# Patient Record
Sex: Female | Born: 1935 | Race: White | Hispanic: No | Marital: Married | Smoking: Former smoker
Health system: Southern US, Community
[De-identification: ages and names within clinical notes are randomized; demographics above are authoritative.]

## PROBLEM LIST (undated history)

## (undated) DIAGNOSIS — G4730 Sleep apnea, unspecified: Secondary | ICD-10-CM

## (undated) DIAGNOSIS — Z5189 Encounter for other specified aftercare: Secondary | ICD-10-CM

## (undated) DIAGNOSIS — M1990 Unspecified osteoarthritis, unspecified site: Secondary | ICD-10-CM

## (undated) DIAGNOSIS — I2510 Atherosclerotic heart disease of native coronary artery without angina pectoris: Secondary | ICD-10-CM

## (undated) DIAGNOSIS — K219 Gastro-esophageal reflux disease without esophagitis: Secondary | ICD-10-CM

## (undated) DIAGNOSIS — I739 Peripheral vascular disease, unspecified: Secondary | ICD-10-CM

## (undated) DIAGNOSIS — E119 Type 2 diabetes mellitus without complications: Secondary | ICD-10-CM

## (undated) DIAGNOSIS — J449 Chronic obstructive pulmonary disease, unspecified: Secondary | ICD-10-CM

## (undated) DIAGNOSIS — R011 Cardiac murmur, unspecified: Secondary | ICD-10-CM

## (undated) DIAGNOSIS — I219 Acute myocardial infarction, unspecified: Secondary | ICD-10-CM

## (undated) DIAGNOSIS — I10 Essential (primary) hypertension: Secondary | ICD-10-CM

## (undated) DIAGNOSIS — J45909 Unspecified asthma, uncomplicated: Secondary | ICD-10-CM

## (undated) DIAGNOSIS — G2581 Restless legs syndrome: Secondary | ICD-10-CM

## (undated) HISTORY — PX: ORIF ANKLE FRACTURE: SHX5408

## (undated) HISTORY — DX: Type 2 diabetes mellitus without complications: E11.9

## (undated) HISTORY — PX: APPENDECTOMY: SHX54

## (undated) HISTORY — DX: Unspecified osteoarthritis, unspecified site: M19.90

## (undated) HISTORY — PX: TONSILLECTOMY: SUR1361

## (undated) HISTORY — PX: DILATION AND CURETTAGE OF UTERUS: SHX78

## (undated) HISTORY — PX: EYE SURGERY: SHX253

## (undated) HISTORY — DX: Encounter for other specified aftercare: Z51.89

## (undated) HISTORY — PX: COLONOSCOPY: SHX5424

## (undated) HISTORY — PX: ABDOMINAL AORTIC ANEURYSM REPAIR: SUR1152

## (undated) HISTORY — DX: Acute myocardial infarction, unspecified: I21.9

## (undated) HISTORY — PX: THYROIDECTOMY, PARTIAL: SHX18

## (undated) HISTORY — DX: Gastro-esophageal reflux disease without esophagitis: K21.9

---

## 1964-07-30 HISTORY — PX: ABDOMINAL HYSTERECTOMY: SHX81

## 1995-07-31 DIAGNOSIS — I2510 Atherosclerotic heart disease of native coronary artery without angina pectoris: Secondary | ICD-10-CM

## 1995-07-31 HISTORY — DX: Atherosclerotic heart disease of native coronary artery without angina pectoris: I25.10

## 2012-07-30 HISTORY — PX: CHOLECYSTECTOMY: SHX55

## 2013-12-15 ENCOUNTER — Other Ambulatory Visit: Payer: Self-pay | Admitting: Specialist

## 2013-12-15 DIAGNOSIS — N6452 Nipple discharge: Secondary | ICD-10-CM

## 2013-12-23 ENCOUNTER — Ambulatory Visit
Admission: RE | Admit: 2013-12-23 | Discharge: 2013-12-23 | Disposition: A | Discharge status: Home / Self Care | Payer: Medicare Other | Source: Ambulatory Visit | Attending: Specialist | Admitting: Specialist

## 2013-12-23 DIAGNOSIS — N6452 Nipple discharge: Secondary | ICD-10-CM

## 2014-01-11 ENCOUNTER — Encounter (INDEPENDENT_AMBULATORY_CARE_PROVIDER_SITE_OTHER): Payer: Self-pay | Admitting: Surgery

## 2014-01-11 ENCOUNTER — Ambulatory Visit (INDEPENDENT_AMBULATORY_CARE_PROVIDER_SITE_OTHER): Payer: Medicare Other | Admitting: Surgery

## 2014-01-11 VITALS — BP 124/76 | HR 62 | Resp 14 | Ht 65.0 in | Wt 167.4 lb

## 2014-01-11 DIAGNOSIS — N6459 Other signs and symptoms in breast: Secondary | ICD-10-CM

## 2014-01-11 DIAGNOSIS — N6452 Nipple discharge: Secondary | ICD-10-CM | POA: Insufficient documentation

## 2014-01-11 NOTE — Progress Notes (Signed)
Patient ID: Meagan Sanders, female   DOB: 1933/03/02, 80 y.o.   MRN: [id number]  No chief complaint on file.   HPI Meagan PerlaJean Romanek is a 80 y.o. female.   HPI Patient sent at the request at the [facility] [place] and Glori Bickersajendra Trivedi, MD For 1 year  history of left nipple discharge. The discharge is using clear but of late patient has noted some blood in the discharge. No mass or breast tenderness. No redness. Right breast is normal. Ductogram reveals filling defect left breast 2 cm from nipple consistent with papilloma. Past Medical History  Diagnosis Date  . Arthritis   . Blood transfusion without reported diagnosis   . Diabetes mellitus without complication   . GERD (gastroesophageal reflux disease)   . Myocardial infarction     Past Surgical History  Procedure Laterality Date  . Abdominal aortic aneurysm repair    . Cholecystectomy    . Abdominal hysterectomy      Family History  Problem Relation Age of Onset  . Breast cancer Sister   . Stomach cancer Mother     Social History History  Substance Use Topics  . Smoking status: Former Games developermoker  . Smokeless tobacco: Not on file  . Alcohol Use: Not on file    Allergies  Allergen Reactions  . Procaine Other (See Comments)    In 1960's with dental work, pt lost consciousness briefly     Current Outpatient Prescriptions  Medication Sig Dispense Refill  . acetaZOLAMIDE (DIAMOX) 250 MG tablet       . albuterol (VENTOLIN HFA) 108 (90 BASE) MCG/ACT inhaler Inhale into the lungs.      Marland Kitchen. allopurinol (ZYLOPRIM) 300 MG tablet daily.      Marland Kitchen. allopurinol (ZYLOPRIM) 300 MG tablet       . aspirin EC 81 MG tablet Take by mouth.      Marland Kitchen. atorvastatin (LIPITOR) 40 MG tablet daily.      . Calcium Carbonate-Vitamin D 600-400 MG-UNIT per tablet Take by mouth.      . Choline Fenofibrate (FENOFIBRIC ACID) 135 MG CPDR Take by mouth.      . clopidogrel (PLAVIX) 75 MG tablet       . Cyanocobalamin (VITAMIN B-12) 2500 MCG SUBL Place under the  tongue.      . dipyridamole-aspirin (AGGRENOX) 200-25 MG per 12 hr capsule Take by mouth.      Tery Sanfilippo. Docusate Sodium 100 MG capsule Take by mouth.      . DULoxetine (CYMBALTA) 60 MG capsule Take by mouth.      . ferrous sulfate (IRON SUPPLEMENT) 325 (65 FE) MG tablet Take by mouth.      . metoprolol succinate (TOPROL-XL) 25 MG 24 hr tablet Take by mouth.      Marland Kitchen. omeprazole (PRILOSEC) 40 MG capsule 2 (two) times daily.      Marland Kitchen. omeprazole (PRILOSEC) 40 MG capsule       . promethazine (PHENERGAN) 25 MG suppository Place rectally.      . repaglinide (PRANDIN) 0.5 MG tablet Take by mouth.      Marland Kitchen. rOPINIRole (REQUIP) 0.5 MG tablet       . theophylline (THEODUR) 300 MG 12 hr tablet 2 (two) times daily.      . theophylline (THEODUR) 300 MG 12 hr tablet       . tiotropium (SPIRIVA HANDIHALER) 18 MCG inhalation capsule Place into inhaler and inhale.      . traMADol (ULTRAM) 50 MG tablet Take by mouth.      .Marland Kitchen  traMADol (ULTRAM) 50 MG tablet       . trandolapril (MAVIK) 4 MG tablet Take by mouth.      . triamcinolone (NASACORT) 55 MCG/ACT AERO nasal inhaler Place into the nose.      . vitamin C (ASCORBIC ACID) 500 MG tablet Take by mouth.      . Vitamin D, Ergocalciferol, (DRISDOL) 50000 UNITS CAPS capsule Take by mouth.      . vitamin E 400 UNIT capsule Take by mouth.       No current facility-administered medications for this visit.    Review of Systems Review of Systems  Constitutional: Negative for fever, chills and unexpected weight change.  HENT: Negative for congestion, hearing loss, sore throat, trouble swallowing and voice change.   Eyes: Negative for visual disturbance.  Respiratory: Negative for cough and wheezing.   Cardiovascular: Negative for chest pain, palpitations and leg swelling.  Gastrointestinal: Negative for nausea, vomiting, abdominal pain, diarrhea, constipation, blood in stool, abdominal distention and anal bleeding.  Genitourinary: Negative for hematuria, vaginal bleeding and  difficulty urinating.  Musculoskeletal: Negative for arthralgias.  Skin: Negative for rash and wound.  Neurological: Negative for seizures, syncope and headaches.  Hematological: Negative for adenopathy. Does not bruise/bleed easily.  Psychiatric/Behavioral: Negative for confusion.    Blood pressure 124/76, pulse 62, resp. rate 14, height 5\' 5"  (1.651 m), weight 167 lb 6.4 oz (75.932 kg).  Physical Exam Physical Exam  Constitutional: She is oriented to person, place, and time. She appears well-developed and well-nourished.  HENT:  Head: Normocephalic.  Mouth/Throat: No oropharyngeal exudate.  Eyes: Pupils are equal, round, and reactive to light. No scleral icterus.  Neck: Normal range of motion. Neck supple.  Cardiovascular: Normal rate and regular rhythm.   Pulmonary/Chest: Right breast exhibits no inverted nipple, no mass, no nipple discharge, no skin change and no tenderness. Left breast exhibits nipple discharge. Left breast exhibits no inverted nipple, no mass, no skin change and no tenderness. Breasts are symmetrical.    Musculoskeletal: Normal range of motion.  Lymphadenopathy:    She has no cervical adenopathy.  Neurological: She is alert and oriented to person, place, and time.  Skin: Skin is warm and dry.  Psychiatric: She has a normal mood and affect. Her behavior is normal. Judgment and thought content normal.    Data Reviewed Left breast ductogram filling defect 2 cm from nipple 6:00 position consistent with papilloma  Assessment    Left breast nipple discharge with intraductal filling defects    Plan    Recommend left breast central duct excision for probable papilloma and bloody nipple discharge. The procedure has been discussed with the patient. Alternatives to surgery have been discussed with the patient.  Risks of surgery include bleeding,  Infection,  Seroma formation, death,  and the need for further surgery.   The patient understands and wishes to  proceed.       Daelen Belvedere A. 01/11/2014, 12:23 PM

## 2014-01-11 NOTE — Patient Instructions (Signed)
Breast Biopsy  A breast biopsy is a procedure where a sample of breast tissue is removed from your breast. The tissue is examined under a microscope to see if cancerous cells are present. A breast biopsy is done when there is:  · Any undiagnosed breast mass (tumor).  · Nipple abnormalities, dimpling, crusting, or ulcerations.  · Abnormal discharge from the nipple, especially blood.  · Redness, swelling, and pain of the breast.  · Calcium deposits (calcifications) or abnormalities seen on a mammogram, ultrasound result, or results of magnetic resonance imaging (MRI).  · Suspicious changes in the breast seen on your mammogram.  If the tumor is found to be cancerous (malignant), a breast biopsy can help to determine what the best treatment is for you. There are many different types of breast biopsies. Talk to your caregiver about your options and which type is best for you.  LET YOUR CAREGIVER KNOW ABOUT:  · Allergies to food or medicine.  · Medicines taken, including vitamins, herbs, eyedrops, over-the-counter medicines, and creams.  · Use of steroids (by mouth or creams).  · Previous problems with anesthetics or numbing medicines.  · History of bleeding problems or blood clots.  · Previous surgery.  · Other health problems, including diabetes and kidney problems.  · Any recent colds or infections.  · Possibility of pregnancy, if this applies.  RISKS AND COMPLICATIONS   · Bleeding.  · Infection.  · Allergy to medicines.  · Bruising and swelling of the breast.  · Alteration in the shape of the breast.  · Not finding the lump or abnormality.  · Needing more surgery.  BEFORE THE PROCEDURE  · Arrange for someone to drive you home after the procedure.  · Do not smoke for 2 weeks before the procedure. Stop smoking, if you smoke.  · Do not drink alcohol for 24 hours before procedure.  · Wear a good support bra to the procedure.  PROCEDURE   You may be given a medicine to numb the breast area (local anesthesia) or a medicine  to make you sleep (general anesthesia) during the procedure. The following are the different types of biopsies that can be performed.   · Fine-needle aspiration A thin needle is attached to a syringe and inserted into the breast lump. Fluid and cells are removed and then looked at under a microscope. If the breast lump cannot be felt, an ultrasound may be used to help locate the lump and place the needle in the correct area.    · Core needle biopsy A wide, hollow needle (core needle) is inserted into the breast lump 3 6 times to get tissue samples or cores. The samples are removed. The needle is usually placed in the correct area by using an ultrasound or X-ray.    · Stereotactic biopsy X-ray equipment and a computer are used to analyze X-ray pictures of the breast lump. The computer then finds exactly where the core needle needs to be inserted. Tissue samples are removed.    · Vacuum-assisted biopsy A small incision (less than ¼ inch) is made in your breast. A biopsy device that includes a hollow needle and vacuum is passed through the incision and into the breast tissue. The vacuum gently draws abnormal breast tissue into the needle to remove it. This type of biopsy removes a larger tissue sample than a regular core needle biopsy. No stitches are needed, and there is usually little scarring.  · Ultrasound-guided core needle biopsy A high frequency ultrasound helps guide   the core needle to the area of the mass or abnormality. An incision is made to insert the needle. Tissue samples are removed.  · Open biopsy A larger incision is made in the breast. Your caregiver will attempt to remove the whole breast lump or as much as possible.  AFTER THE PROCEDURE  · You will be taken to the recovery area. If you are doing well and have no problems, you will be allowed to go home.  · You may notice bruising on your breast. This is normal.  · Your caregiver may apply a pressure dressing on your breast for 24 48 hours. A  pressure dressing is a bandage that is wrapped tightly around the chest to stop fluid from collecting underneath tissues.  Document Released: 07/16/2005 Document Revised: 11/10/2012 Document Reviewed: 08/16/2011  [employer]® Patient Information ©2014 [employer].

## 2014-01-20 ENCOUNTER — Encounter (HOSPITAL_BASED_OUTPATIENT_CLINIC_OR_DEPARTMENT_OTHER): Payer: Self-pay | Admitting: *Deleted

## 2014-01-20 NOTE — Progress Notes (Signed)
Went over instructions very carefully-she took notes and could say back=to go to [facility] for labs and ekg-will get cardiac notes

## 2014-01-21 ENCOUNTER — Encounter (HOSPITAL_COMMUNITY)
Admission: RE | Admit: 2014-01-21 | Discharge: 2014-01-21 | Disposition: A | Discharge status: Home / Self Care | Payer: Medicare Other | Source: Ambulatory Visit | Attending: Surgery | Admitting: Surgery

## 2014-01-21 ENCOUNTER — Other Ambulatory Visit: Payer: Self-pay

## 2014-01-21 DIAGNOSIS — Z01810 Encounter for preprocedural cardiovascular examination: Secondary | ICD-10-CM | POA: Insufficient documentation

## 2014-01-21 DIAGNOSIS — Z01812 Encounter for preprocedural laboratory examination: Secondary | ICD-10-CM | POA: Insufficient documentation

## 2014-01-21 LAB — COMPREHENSIVE METABOLIC PANEL
ALK PHOS: 40 U/L (ref 39–117)
ALT: 21 U/L (ref 0–35)
AST: 21 U/L (ref 0–37)
Albumin: 3.6 g/dL (ref 3.5–5.2)
BUN: 33 mg/dL — ABNORMAL HIGH (ref 6–23)
CO2: 23 meq/L (ref 19–32)
Calcium: 10.6 mg/dL — ABNORMAL HIGH (ref 8.4–10.5)
Chloride: 107 mEq/L (ref 96–112)
Creatinine, Ser: 1.4 mg/dL — ABNORMAL HIGH (ref 0.50–1.10)
GFR calc Af Amer: 41 mL/min — ABNORMAL LOW (ref >90)
GFR, EST NON AFRICAN AMERICAN: 35 mL/min — AB (ref >90)
Glucose, Bld: 185 mg/dL — ABNORMAL HIGH (ref 70–99)
POTASSIUM: 4.2 meq/L (ref 3.7–5.3)
SODIUM: 143 meq/L (ref 137–147)
Total Bilirubin: 0.2 mg/dL — ABNORMAL LOW (ref 0.3–1.2)
Total Protein: 7 g/dL (ref 6.0–8.3)

## 2014-01-21 LAB — CBC WITH DIFFERENTIAL/PLATELET
Basophils Absolute: 0 10*3/uL (ref 0.0–0.1)
Basophils Relative: 0 % (ref 0–1)
Eosinophils Absolute: 0.5 10*3/uL (ref 0.0–0.7)
Eosinophils Relative: 8 % — ABNORMAL HIGH (ref 0–5)
HCT: 30.9 % — ABNORMAL LOW (ref 36.0–46.0)
Hemoglobin: 9.7 g/dL — ABNORMAL LOW (ref 12.0–15.0)
LYMPHS ABS: 1.4 10*3/uL (ref 0.7–4.0)
LYMPHS PCT: 23 % (ref 12–46)
MCH: 28.4 pg (ref 26.0–34.0)
MCHC: 31.4 g/dL (ref 30.0–36.0)
MCV: 90.6 fL (ref 78.0–100.0)
Monocytes Absolute: 0.4 10*3/uL (ref 0.1–1.0)
Monocytes Relative: 8 % (ref 3–12)
NEUTROS ABS: 3.6 10*3/uL (ref 1.7–7.7)
NEUTROS PCT: 61 % (ref 43–77)
PLATELETS: 243 10*3/uL (ref 150–400)
RBC: 3.41 MIL/uL — AB (ref 3.87–5.11)
RDW: 15.4 % (ref 11.5–15.5)
WBC: 5.9 10*3/uL (ref 4.0–10.5)

## 2014-01-26 ENCOUNTER — Ambulatory Visit (HOSPITAL_BASED_OUTPATIENT_CLINIC_OR_DEPARTMENT_OTHER)
Admission: RE | Admit: 2014-01-26 | Discharge: 2014-01-26 | Disposition: A | Discharge status: Home / Self Care | Payer: Medicare Other | Source: Ambulatory Visit | Attending: Surgery | Admitting: Surgery

## 2014-01-26 ENCOUNTER — Encounter (HOSPITAL_BASED_OUTPATIENT_CLINIC_OR_DEPARTMENT_OTHER): Payer: Self-pay | Admitting: Anesthesiology

## 2014-01-26 ENCOUNTER — Encounter (HOSPITAL_BASED_OUTPATIENT_CLINIC_OR_DEPARTMENT_OTHER)
Admission: RE | Disposition: A | Discharge status: Home / Self Care | Payer: Self-pay | Source: Ambulatory Visit | Attending: Surgery

## 2014-01-26 ENCOUNTER — Encounter (HOSPITAL_BASED_OUTPATIENT_CLINIC_OR_DEPARTMENT_OTHER): Payer: Medicare Other | Admitting: Anesthesiology

## 2014-01-26 ENCOUNTER — Ambulatory Visit (HOSPITAL_BASED_OUTPATIENT_CLINIC_OR_DEPARTMENT_OTHER): Payer: Medicare Other | Admitting: Anesthesiology

## 2014-01-26 DIAGNOSIS — N6452 Nipple discharge: Secondary | ICD-10-CM

## 2014-01-26 DIAGNOSIS — J449 Chronic obstructive pulmonary disease, unspecified: Secondary | ICD-10-CM | POA: Insufficient documentation

## 2014-01-26 DIAGNOSIS — J4489 Other specified chronic obstructive pulmonary disease: Secondary | ICD-10-CM | POA: Insufficient documentation

## 2014-01-26 DIAGNOSIS — Z79899 Other long term (current) drug therapy: Secondary | ICD-10-CM | POA: Insufficient documentation

## 2014-01-26 DIAGNOSIS — I2510 Atherosclerotic heart disease of native coronary artery without angina pectoris: Secondary | ICD-10-CM | POA: Insufficient documentation

## 2014-01-26 DIAGNOSIS — Z7982 Long term (current) use of aspirin: Secondary | ICD-10-CM | POA: Insufficient documentation

## 2014-01-26 DIAGNOSIS — K219 Gastro-esophageal reflux disease without esophagitis: Secondary | ICD-10-CM | POA: Insufficient documentation

## 2014-01-26 DIAGNOSIS — Z884 Allergy status to anesthetic agent status: Secondary | ICD-10-CM | POA: Insufficient documentation

## 2014-01-26 DIAGNOSIS — Z7902 Long term (current) use of antithrombotics/antiplatelets: Secondary | ICD-10-CM | POA: Insufficient documentation

## 2014-01-26 DIAGNOSIS — I10 Essential (primary) hypertension: Secondary | ICD-10-CM | POA: Insufficient documentation

## 2014-01-26 DIAGNOSIS — E119 Type 2 diabetes mellitus without complications: Secondary | ICD-10-CM | POA: Insufficient documentation

## 2014-01-26 DIAGNOSIS — I252 Old myocardial infarction: Secondary | ICD-10-CM | POA: Insufficient documentation

## 2014-01-26 DIAGNOSIS — G4730 Sleep apnea, unspecified: Secondary | ICD-10-CM | POA: Insufficient documentation

## 2014-01-26 DIAGNOSIS — N6459 Other signs and symptoms in breast: Secondary | ICD-10-CM | POA: Insufficient documentation

## 2014-01-26 DIAGNOSIS — Z87891 Personal history of nicotine dependence: Secondary | ICD-10-CM | POA: Insufficient documentation

## 2014-01-26 HISTORY — PX: BREAST DUCTAL SYSTEM EXCISION: SHX5242

## 2014-01-26 HISTORY — DX: Restless legs syndrome: G25.81

## 2014-01-26 HISTORY — DX: Atherosclerotic heart disease of native coronary artery without angina pectoris: I25.10

## 2014-01-26 HISTORY — DX: Peripheral vascular disease, unspecified: I73.9

## 2014-01-26 HISTORY — DX: Unspecified asthma, uncomplicated: J45.909

## 2014-01-26 HISTORY — DX: Chronic obstructive pulmonary disease, unspecified: J44.9

## 2014-01-26 HISTORY — DX: Sleep apnea, unspecified: G47.30

## 2014-01-26 HISTORY — DX: Cardiac murmur, unspecified: R01.1

## 2014-01-26 HISTORY — DX: Essential (primary) hypertension: I10

## 2014-01-26 LAB — GLUCOSE, CAPILLARY
GLUCOSE-CAPILLARY: 124 mg/dL — AB (ref 70–99)
Glucose-Capillary: 128 mg/dL — ABNORMAL HIGH (ref 70–99)

## 2014-01-26 SURGERY — EXCISION DUCTAL SYSTEM BREAST
Anesthesia: General | Site: Breast | Laterality: Left

## 2014-01-26 MED ORDER — BUPIVACAINE-EPINEPHRINE (PF) 0.25% -1:200000 IJ SOLN
INTRAMUSCULAR | Status: AC
  Filled 2014-01-26: qty 30

## 2014-01-26 MED ORDER — CEFAZOLIN SODIUM-DEXTROSE 2-3 GM-% IV SOLR: 2.0000 g | INTRAVENOUS | Status: DC

## 2014-01-26 MED ORDER — MIDAZOLAM HCL 2 MG/2ML IJ SOLN
INTRAMUSCULAR | Status: AC
  Filled 2014-01-26: qty 2

## 2014-01-26 MED ORDER — FENTANYL CITRATE 0.05 MG/ML IJ SOLN: 50.0000 ug | INTRAMUSCULAR | Status: DC | PRN

## 2014-01-26 MED ORDER — ONDANSETRON HCL 4 MG/2ML IJ SOLN: 4.0000 mg | Freq: Once | INTRAMUSCULAR | Status: DC | PRN

## 2014-01-26 MED ORDER — MIDAZOLAM HCL 2 MG/2ML IJ SOLN: 1.0000 mg | INTRAMUSCULAR | Status: DC | PRN

## 2014-01-26 MED ORDER — LACTATED RINGERS IV SOLN
INTRAVENOUS | Status: DC
  Administered 2014-01-26: 09:00:00 via INTRAVENOUS

## 2014-01-26 MED ORDER — BUPIVACAINE-EPINEPHRINE 0.25% -1:200000 IJ SOLN
INTRAMUSCULAR | Status: DC | PRN
  Administered 2014-01-26: 10 mL

## 2014-01-26 MED ORDER — FENTANYL CITRATE 0.05 MG/ML IJ SOLN: 25.0000 ug | INTRAMUSCULAR | Status: DC | PRN

## 2014-01-26 MED ORDER — FENTANYL CITRATE 0.05 MG/ML IJ SOLN
INTRAMUSCULAR | Status: AC
Start: 2014-01-26 — End: 2014-01-26
  Filled 2014-01-26: qty 6

## 2014-01-26 MED ORDER — LIDOCAINE HCL (CARDIAC) 20 MG/ML IV SOLN
INTRAVENOUS | Status: DC | PRN
  Administered 2014-01-26: 50 mg via INTRAVENOUS

## 2014-01-26 MED ORDER — LIDOCAINE HCL (CARDIAC) 20 MG/ML IV SOLN: INTRAVENOUS | Status: DC | PRN

## 2014-01-26 MED ORDER — FENTANYL CITRATE 0.05 MG/ML IJ SOLN
INTRAMUSCULAR | Status: DC | PRN
  Administered 2014-01-26 (×2): 50 ug via INTRAVENOUS

## 2014-01-26 MED ORDER — DEXAMETHASONE SODIUM PHOSPHATE 4 MG/ML IJ SOLN
INTRAMUSCULAR | Status: DC | PRN
  Administered 2014-01-26: 10 mg via INTRAVENOUS

## 2014-01-26 MED ORDER — CEFAZOLIN SODIUM-DEXTROSE 2-3 GM-% IV SOLR
INTRAVENOUS | Status: AC
  Filled 2014-01-26: qty 50

## 2014-01-26 MED ORDER — CHLORHEXIDINE GLUCONATE 4 % EX LIQD: 1.0000 "application " | Freq: Once | CUTANEOUS | Status: DC

## 2014-01-26 MED ORDER — HYDROCODONE-ACETAMINOPHEN 5-325 MG PO TABS: 1.0000 | ORAL_TABLET | Freq: Four times a day (QID) | ORAL | Status: AC | PRN

## 2014-01-26 MED ORDER — PROPOFOL 10 MG/ML IV BOLUS
INTRAVENOUS | Status: DC | PRN
  Administered 2014-01-26: 150 mg via INTRAVENOUS

## 2014-01-26 SURGICAL SUPPLY — 43 items
BLADE SURG 15 STRL LF DISP TIS (BLADE) ×1 IMPLANT
BLADE SURG 15 STRL SS (BLADE) ×2
CANISTER SUCT 1200ML W/VALVE (MISCELLANEOUS) ×3 IMPLANT
CHLORAPREP W/TINT 26ML (MISCELLANEOUS) ×3 IMPLANT
CLIP TI WIDE RED SMALL 6 (CLIP) IMPLANT
COVER MAYO STAND STRL (DRAPES) ×3 IMPLANT
COVER TABLE BACK 60X90 (DRAPES) ×3 IMPLANT
DECANTER SPIKE VIAL GLASS SM (MISCELLANEOUS) ×3 IMPLANT
DERMABOND ADVANCED (GAUZE/BANDAGES/DRESSINGS) ×2
DERMABOND ADVANCED .7 DNX12 (GAUZE/BANDAGES/DRESSINGS) ×1 IMPLANT
DEVICE DUBIN W/COMP PLATE 8390 (MISCELLANEOUS) IMPLANT
DRAPE LAPAROSCOPIC ABDOMINAL (DRAPES) IMPLANT
DRAPE PED LAPAROTOMY (DRAPES) ×3 IMPLANT
DRAPE UTILITY XL STRL (DRAPES) ×3 IMPLANT
ELECT COATED BLADE 2.86 ST (ELECTRODE) ×3 IMPLANT
ELECT REM PT RETURN 9FT ADLT (ELECTROSURGICAL) ×3
ELECTRODE REM PT RTRN 9FT ADLT (ELECTROSURGICAL) ×1 IMPLANT
GLOVE BIOGEL PI IND STRL 7.0 (GLOVE) ×1 IMPLANT
GLOVE BIOGEL PI IND STRL 8 (GLOVE) ×1 IMPLANT
GLOVE BIOGEL PI INDICATOR 7.0 (GLOVE) ×2
GLOVE BIOGEL PI INDICATOR 8 (GLOVE) ×2
GLOVE ECLIPSE 8.0 STRL XLNG CF (GLOVE) ×3 IMPLANT
GLOVE SURG SS PI 6.5 STRL IVOR (GLOVE) ×3 IMPLANT
GLOVE SURG SS PI 7.0 STRL IVOR (GLOVE) ×3 IMPLANT
GOWN STRL REUS W/ TWL LRG LVL3 (GOWN DISPOSABLE) ×3 IMPLANT
GOWN STRL REUS W/TWL LRG LVL3 (GOWN DISPOSABLE) ×6
NEEDLE HYPO 25X1 1.5 SAFETY (NEEDLE) ×3 IMPLANT
NS IRRIG 1000ML POUR BTL (IV SOLUTION) ×3 IMPLANT
PACK BASIN DAY SURGERY FS (CUSTOM PROCEDURE TRAY) ×3 IMPLANT
PENCIL BUTTON HOLSTER BLD 10FT (ELECTRODE) ×3 IMPLANT
SLEEVE SCD COMPRESS KNEE MED (MISCELLANEOUS) ×3 IMPLANT
SPONGE LAP 4X18 X RAY DECT (DISPOSABLE) ×3 IMPLANT
STAPLER VISISTAT 35W (STAPLE) IMPLANT
SUT MON AB 4-0 PC3 18 (SUTURE) ×3 IMPLANT
SUT SILK 2 0 SH (SUTURE) IMPLANT
SUT VIC AB 3-0 SH 27 (SUTURE) ×2
SUT VIC AB 3-0 SH 27X BRD (SUTURE) ×1 IMPLANT
SYR CONTROL 10ML LL (SYRINGE) ×3 IMPLANT
TOWEL OR 17X24 6PK STRL BLUE (TOWEL DISPOSABLE) ×3 IMPLANT
TOWEL OR NON WOVEN STRL DISP B (DISPOSABLE) IMPLANT
TUBE CONNECTING 20'X1/4 (TUBING) ×1
TUBE CONNECTING 20X1/4 (TUBING) ×2 IMPLANT
YANKAUER SUCT BULB TIP NO VENT (SUCTIONS) ×3 IMPLANT

## 2014-01-26 NOTE — Anesthesia Postprocedure Evaluation (Signed)
  Anesthesia Post-op Note  Patient: Meagan Sanders  Procedure(s) Performed: Procedure(s): LEFT BREAST CENTRAL DUCT EXCISION  (Left)  Patient Location: PACU  Anesthesia Type:General  Level of Consciousness: awake and alert   Airway and Oxygen Therapy: Patient Spontanous Breathing  Post-op Pain: none  Post-op Assessment: Post-op Vital signs reviewed, Patient's Cardiovascular Status Stable and Respiratory Function Stable  Post-op Vital Signs: Reviewed  Filed Vitals:   01/26/14 1115  BP: 136/44  Pulse: 53  Temp:   Resp: 14    Complications: No apparent anesthesia complications

## 2014-01-26 NOTE — Op Note (Signed)
Preop diagnosis: Bloody nipple discharge left   Postop diagnosis: Same  Procedure: Central breast duct excision left   Surgeon: Harriette Bouillonhomas Serigne Kubicek M.D.  Anesthesia: Gen. endotracheal with local anesthesia  EBL: Minimal  Specimen: Breast tissue to pathology  Drains: None  Indications for procedure: The patient presents for central duct excision of the breast due to bloody nipple discharge. Workup included mammography and ultrasound. A filling defect was noted in the central ducts of the  Left breast at 6:00 2 cm from nipple. Discussion of operative and nonoperative means were done with the patient. Risks of operative excision include bleeding, infection, recurrence, and the need for the surgery, seroma, anesthesia risks. Observation is the other choice discussed. The patient wishes to proceed for excision.  Description of procedure: The patient was seen all the area. Questions are answered. The breast was marked. The patient was taken back to the operating room. The patient was placed supine on the operating room table. General anesthesia was initiated. Upper chest was prepped and draped in a sterile fashion. A timeout was done. She received preoperative antibiotics within an hour of incision. A curvilinear incision was made along the border of the nipple. The nipple was elevated carefully. The central duct was identified a probe and correlated with preoperative imaging. The duct and neighboring tissue were excised. This was sent to pathology. The wound was irrigated and then closed with 3-0 Vicryl and 4-0 Monocryl in a subcuticular fashion Dermabond was applied. Patient was awoke and taken to recovery in satisfactory condition. All final counts the sponge, instruments and needles were correct.

## 2014-01-26 NOTE — Interval H&P Note (Signed)
History and Physical Interval Note:  01/26/2014 9:33 AM  Meagan Sanders  has presented today for surgery, with the diagnosis of left breast nipple discharge  The various methods of treatment have been discussed with the patient and family. After consideration of risks, benefits and other options for treatment, the patient has consented to  Procedure(s): LEFT BREAST CENTRAL DUCT EXCISION  (Left) as a surgical intervention .  The patient's history has been reviewed, patient examined, no change in status, stable for surgery.  I have reviewed the patient's chart and labs.  Questions were answered to the patient's satisfaction.     CORNETT,THOMAS A.

## 2014-01-26 NOTE — H&P (View-Only) (Signed)
Patient ID: Meagan Sanders, female   DOB: 1933/03/02, 80 y.o.   MRN: [id number]  No chief complaint on file.   HPI Meagan Sanders is a 80 y.o. female.   HPI Patient sent at the request at the [facility] and Glori Bickersajendra Trivedi, MD For 1 year  history of left nipple discharge. The discharge is using clear but of late patient has noted some blood in the discharge. No mass or breast tenderness. No redness. Right breast is normal. Ductogram reveals filling defect left breast 2 cm from nipple consistent with papilloma. Past Medical History  Diagnosis Date  . Arthritis   . Blood transfusion without reported diagnosis   . Diabetes mellitus without complication   . GERD (gastroesophageal reflux disease)   . Myocardial infarction     Past Surgical History  Procedure Laterality Date  . Abdominal aortic aneurysm repair    . Cholecystectomy    . Abdominal hysterectomy      Family History  Problem Relation Age of Onset  . Breast cancer Sister   . Stomach cancer Mother     Social History History  Substance Use Topics  . Smoking status: Former Games developermoker  . Smokeless tobacco: Not on file  . Alcohol Use: Not on file    Allergies  Allergen Reactions  . Procaine Other (See Comments)    In 1960's with dental work, pt lost consciousness briefly     Current Outpatient Prescriptions  Medication Sig Dispense Refill  . acetaZOLAMIDE (DIAMOX) 250 MG tablet       . albuterol (VENTOLIN HFA) 108 (90 BASE) MCG/ACT inhaler Inhale into the lungs.      Marland Kitchen. allopurinol (ZYLOPRIM) 300 MG tablet daily.      Marland Kitchen. allopurinol (ZYLOPRIM) 300 MG tablet       . aspirin EC 81 MG tablet Take by mouth.      Marland Kitchen. atorvastatin (LIPITOR) 40 MG tablet daily.      . Calcium Carbonate-Vitamin D 600-400 MG-UNIT per tablet Take by mouth.      . Choline Fenofibrate (FENOFIBRIC ACID) 135 MG CPDR Take by mouth.      . clopidogrel (PLAVIX) 75 MG tablet       . Cyanocobalamin (VITAMIN B-12) 2500 MCG SUBL Place under the  tongue.      . dipyridamole-aspirin (AGGRENOX) 200-25 MG per 12 hr capsule Take by mouth.      Tery Sanfilippo. Docusate Sodium 100 MG capsule Take by mouth.      . DULoxetine (CYMBALTA) 60 MG capsule Take by mouth.      . ferrous sulfate (IRON SUPPLEMENT) 325 (65 FE) MG tablet Take by mouth.      . metoprolol succinate (TOPROL-XL) 25 MG 24 hr tablet Take by mouth.      Marland Kitchen. omeprazole (PRILOSEC) 40 MG capsule 2 (two) times daily.      Marland Kitchen. omeprazole (PRILOSEC) 40 MG capsule       . promethazine (PHENERGAN) 25 MG suppository Place rectally.      . repaglinide (PRANDIN) 0.5 MG tablet Take by mouth.      Marland Kitchen. rOPINIRole (REQUIP) 0.5 MG tablet       . theophylline (THEODUR) 300 MG 12 hr tablet 2 (two) times daily.      . theophylline (THEODUR) 300 MG 12 hr tablet       . tiotropium (SPIRIVA HANDIHALER) 18 MCG inhalation capsule Place into inhaler and inhale.      . traMADol (ULTRAM) 50 MG tablet Take by mouth.      .Marland Kitchen  traMADol (ULTRAM) 50 MG tablet       . trandolapril (MAVIK) 4 MG tablet Take by mouth.      . triamcinolone (NASACORT) 55 MCG/ACT AERO nasal inhaler Place into the nose.      . vitamin C (ASCORBIC ACID) 500 MG tablet Take by mouth.      . Vitamin D, Ergocalciferol, (DRISDOL) 50000 UNITS CAPS capsule Take by mouth.      . vitamin E 400 UNIT capsule Take by mouth.       No current facility-administered medications for this visit.    Review of Systems Review of Systems  Constitutional: Negative for fever, chills and unexpected weight change.  HENT: Negative for congestion, hearing loss, sore throat, trouble swallowing and voice change.   Eyes: Negative for visual disturbance.  Respiratory: Negative for cough and wheezing.   Cardiovascular: Negative for chest pain, palpitations and leg swelling.  Gastrointestinal: Negative for nausea, vomiting, abdominal pain, diarrhea, constipation, blood in stool, abdominal distention and anal bleeding.  Genitourinary: Negative for hematuria, vaginal bleeding and  difficulty urinating.  Musculoskeletal: Negative for arthralgias.  Skin: Negative for rash and wound.  Neurological: Negative for seizures, syncope and headaches.  Hematological: Negative for adenopathy. Does not bruise/bleed easily.  Psychiatric/Behavioral: Negative for confusion.    Blood pressure 124/76, pulse 62, resp. rate 14, height 5\' 5"  (1.651 m), weight 167 lb 6.4 oz (75.932 kg).  Physical Exam Physical Exam  Constitutional: She is oriented to person, place, and time. She appears well-developed and well-nourished.  HENT:  Head: Normocephalic.  Mouth/Throat: No oropharyngeal exudate.  Eyes: Pupils are equal, round, and reactive to light. No scleral icterus.  Neck: Normal range of motion. Neck supple.  Cardiovascular: Normal rate and regular rhythm.   Pulmonary/Chest: Right breast exhibits no inverted nipple, no mass, no nipple discharge, no skin change and no tenderness. Left breast exhibits nipple discharge. Left breast exhibits no inverted nipple, no mass, no skin change and no tenderness. Breasts are symmetrical.    Musculoskeletal: Normal range of motion.  Lymphadenopathy:    She has no cervical adenopathy.  Neurological: She is alert and oriented to person, place, and time.  Skin: Skin is warm and dry.  Psychiatric: She has a normal mood and affect. Her behavior is normal. Judgment and thought content normal.    Data Reviewed Left breast ductogram filling defect 2 cm from nipple 6:00 position consistent with papilloma  Assessment    Left breast nipple discharge with intraductal filling defects    Plan    Recommend left breast central duct excision for probable papilloma and bloody nipple discharge. The procedure has been discussed with the patient. Alternatives to surgery have been discussed with the patient.  Risks of surgery include bleeding,  Infection,  Seroma formation, death,  and the need for further surgery.   The patient understands and wishes to  proceed.       Jimmy Stipes A. 01/11/2014, 12:23 PM

## 2014-01-26 NOTE — Transfer of Care (Signed)
Immediate Anesthesia Transfer of Care Note  Patient: Meagan Sanders  Procedure(s) Performed: Procedure(s): LEFT BREAST CENTRAL DUCT EXCISION  (Left)  Patient Location: PACU  Anesthesia Type:General  Level of Consciousness: sedated and patient cooperative  Airway & Oxygen Therapy: Patient Spontanous Breathing and Patient connected to face mask oxygen  Post-op Assessment: Report given to PACU RN and Post -op Vital signs reviewed and stable  Post vital signs: Reviewed and stable  Complications: No apparent anesthesia complications

## 2014-01-26 NOTE — Anesthesia Preprocedure Evaluation (Signed)
Anesthesia Evaluation  Patient identified by MRN, date of birth, ID band Patient awake    Reviewed: Allergy & Precautions, H&P , NPO status , Patient's Chart, lab work & pertinent test results  Airway Mallampati: I TM Distance: >3 FB Neck ROM: Full    Dental  (+) Dental Advisory Given, Teeth Intact   Pulmonary asthma , sleep apnea and Continuous Positive Airway Pressure Ventilation , COPD COPD inhaler, former smoker,  breath sounds clear to auscultation        Cardiovascular hypertension, Pt. on medications + CAD and + Past MI Rhythm:Regular Rate:Normal     Neuro/Psych    GI/Hepatic GERD-  Medicated and Controlled,  Endo/Other  diabetes, Well Controlled, Type 2, Oral Hypoglycemic Agents  Renal/GU      Musculoskeletal   Abdominal   Peds  Hematology   Anesthesia Other Findings Dental implants  Reproductive/Obstetrics                           Anesthesia Physical Anesthesia Plan  ASA: III  Anesthesia Plan: General   Post-op Pain Management:    Induction: Intravenous  Airway Management Planned: LMA  Additional Equipment:   Intra-op Plan:   Post-operative Plan: Extubation in OR  Informed Consent: I have reviewed the patients History and Physical, chart, labs and discussed the procedure including the risks, benefits and alternatives for the proposed anesthesia with the patient or authorized representative who has indicated his/her understanding and acceptance.   Dental advisory given  Plan Discussed with: CRNA, Anesthesiologist and Surgeon  Anesthesia Plan Comments:         Anesthesia Quick Evaluation

## 2014-01-26 NOTE — Discharge Instructions (Signed)
[facility] °Office Phone Number [phone] ° °BREAST BIOPSY/ PARTIAL MASTECTOMY: POST OP INSTRUCTIONS ° °Always review your discharge instruction sheet given to you by the facility where your surgery was performed. ° °IF YOU HAVE DISABILITY OR FAMILY LEAVE FORMS, YOU MUST BRING THEM TO THE OFFICE FOR PROCESSING.  DO NOT GIVE THEM TO YOUR DOCTOR. ° °1. A prescription for pain medication may be given to you upon discharge.  Take your pain medication as prescribed, if needed.  If narcotic pain medicine is not needed, then you may take acetaminophen (Tylenol) or ibuprofen (Advil) as needed. °2. Take your usually prescribed medications unless otherwise directed °3. If you need a refill on your pain medication, please contact your pharmacy.  They will contact our office to request authorization.  Prescriptions will not be filled after 5pm or on week-ends. °4. You should eat very light the first 24 hours after surgery, such as soup, crackers, pudding, etc.  Resume your normal diet the day after surgery. °5. Most patients will experience some swelling and bruising in the breast.  Ice packs and a good support bra will help.  Swelling and bruising can take several days to resolve.  °6. It is common to experience some constipation if taking pain medication after surgery.  Increasing fluid intake and taking a stool softener will usually help or prevent this problem from occurring.  A mild laxative (Milk of Magnesia or Miralax) should be taken according to package directions if there are no bowel movements after 48 hours. °7. Unless discharge instructions indicate otherwise, you may remove your bandages 24-48 hours after surgery, and you may shower at that time.  You may have steri-strips (small skin tapes) in place directly over the incision.  These strips should be left on the skin for 7-10 days.  If your surgeon used skin glue on the incision, you may shower in 24 hours.  The glue will flake off over the  next 2-3 weeks.  Any sutures or staples will be removed at the office during your follow-up visit. °8. ACTIVITIES:  You may resume regular daily activities (gradually increasing) beginning the next day.  Wearing a good support bra or sports bra minimizes pain and swelling.  You may have sexual intercourse when it is comfortable. °a. You may drive when you no longer are taking prescription pain medication, you can comfortably wear a seatbelt, and you can safely maneuver your car and apply brakes. °b. RETURN TO WORK:  ______________________________________________________________________________________ °9. You should see your doctor in the office for a follow-up appointment approximately two weeks after your surgery.  Your doctor’s nurse will typically make your follow-up appointment when she calls you with your pathology report.  Expect your pathology report 2-3 business days after your surgery.  You may call to check if you do not hear from us after three days. °10. OTHER INSTRUCTIONS: _______________________________________________________________________________________________ _____________________________________________________________________________________________________________________________________ °_____________________________________________________________________________________________________________________________________ °_____________________________________________________________________________________________________________________________________ ° °WHEN TO CALL YOUR DOCTOR: °1. Fever over 101.0 °2. Nausea and/or vomiting. °3. Extreme swelling or bruising. °4. Continued bleeding from incision. °5. Increased pain, redness, or drainage from the incision. ° °The clinic staff is available to answer your questions during regular business hours.  Please don’t hesitate to call and ask to speak to one of the nurses for clinical concerns.  If you have a medical emergency, go to the nearest  emergency room or call 911.  A surgeon from [facility] is always on call at the hospital. ° °For further questions, please visit [email or web address]  ° ° °  Post Anesthesia Home Care Instructions ° °Activity: °Get plenty of rest for the remainder of the day. A responsible adult should stay with you for 24 hours following the procedure.  °For the next 24 hours, DO NOT: °-Drive a car °-Operate machinery °-Drink alcoholic beverages °-Take any medication unless instructed by your physician °-Make any legal decisions or sign important papers. ° °Meals: °Start with liquid foods such as gelatin or soup. Progress to regular foods as tolerated. Avoid greasy, spicy, heavy foods. If nausea and/or vomiting occur, drink only clear liquids until the nausea and/or vomiting subsides. Call your physician if vomiting continues. ° °Special Instructions/Symptoms: °Your throat may feel dry or sore from the anesthesia or the breathing tube placed in your throat during surgery. If this causes discomfort, gargle with warm salt water. The discomfort should disappear within 24 hours. ° °

## 2014-01-26 NOTE — Anesthesia Procedure Notes (Signed)
Procedure Name: LMA Insertion Date/Time: 01/26/2014 9:47 AM Performed by: Genevieve NorlanderLINKA, Meagan L Pre-anesthesia Checklist: Patient identified, Emergency Drugs available, Suction available, Patient being monitored and Timeout performed Patient Re-evaluated:Patient Re-evaluated prior to inductionOxygen Delivery Method: Circle System Utilized Preoxygenation: Pre-oxygenation with 100% oxygen Intubation Type: IV induction Ventilation: Mask ventilation without difficulty LMA: LMA inserted LMA Size: 4.0 Number of attempts: 1 Airway Equipment and Method: bite block Placement Confirmation: positive ETCO2 and breath sounds checked- equal and bilateral Tube secured with: Tape Dental Injury: Teeth and Oropharynx as per pre-operative assessment

## 2014-01-27 ENCOUNTER — Encounter (HOSPITAL_BASED_OUTPATIENT_CLINIC_OR_DEPARTMENT_OTHER): Payer: Self-pay | Admitting: Surgery

## 2014-01-28 ENCOUNTER — Telehealth (INDEPENDENT_AMBULATORY_CARE_PROVIDER_SITE_OTHER): Payer: Self-pay

## 2014-01-28 NOTE — Telephone Encounter (Signed)
Message copied by Brennan BaileyBROOKS, Aldine Grainger on Thu Jan 28, 2014  5:18 PM ------      Message from: Harriette BouillonORNETT, THOMAS A      Created: Wed Jan 27, 2014  4:02 PM       B9 ------

## 2014-01-28 NOTE — Telephone Encounter (Signed)
LMOM for patient to call back or i will call her Monday. Path benign.

## 2014-02-02 ENCOUNTER — Telehealth (INDEPENDENT_AMBULATORY_CARE_PROVIDER_SITE_OTHER): Payer: Self-pay

## 2014-02-02 NOTE — Telephone Encounter (Signed)
Message copied by Brennan BaileyBROOKS, Xcaret Morad on Tue Feb 02, 2014  9:09 AM ------      Message from: Harriette BouillonORNETT, THOMAS A      Created: Wed Jan 27, 2014  4:02 PM       B9 ------

## 2014-02-02 NOTE — Telephone Encounter (Signed)
Called pt with benign path. 

## 2014-02-18 ENCOUNTER — Ambulatory Visit (INDEPENDENT_AMBULATORY_CARE_PROVIDER_SITE_OTHER): Payer: Medicare Other | Admitting: Surgery

## 2014-02-18 ENCOUNTER — Encounter (INDEPENDENT_AMBULATORY_CARE_PROVIDER_SITE_OTHER): Payer: Self-pay | Admitting: Surgery

## 2014-02-18 VITALS — BP 124/80 | HR 76 | Ht 65.0 in | Wt 166.0 lb

## 2014-02-18 DIAGNOSIS — Z9889 Other specified postprocedural states: Secondary | ICD-10-CM

## 2014-02-18 NOTE — Progress Notes (Signed)
Returns 3 weeks after left breast central duct excision.  No complaints   Exam   Left breast incision clean dry intact without signs of infection Breast, excision, left duct - BENIGN BREAST PARENCHYMA WITH ECTATIC DUCTS. - THERE IS NO EVIDENCE OF MALIGNANCY. - SEE COMMENT.   A/P  3 weEks s/p left breast central duct excision   F/U PRN.  Resume full activity

## 2014-02-18 NOTE — Patient Instructions (Signed)
Return as needed. No restrictions.  

## 2015-05-13 IMAGING — MG MM DUCTOGRAM UNILATERAL LEFT
3 series · 3 of 3 positions shown · non-contrast
Comparison: none

ADDENDUM:
The patient contacted me today requesting assistance in setting up a
referral to a [HOSPITAL] surgeon for the left nipple discharge and
abnormal ductogram. An appointment was made with Dr. Julyzitha Covena
with [RED[facility]] on January 11, 2014. The time, directions
and phone number were communicated to the patient.

Riphin Rispin RN, BSN on December 25, 2013
CLINICAL DATA: Spontaneous clear left nipple discharge. More
recently, spontaneous discharge has also been bloody.
EXAM:
DUCTOGRAM UNILATERAL*L*

[L CC]
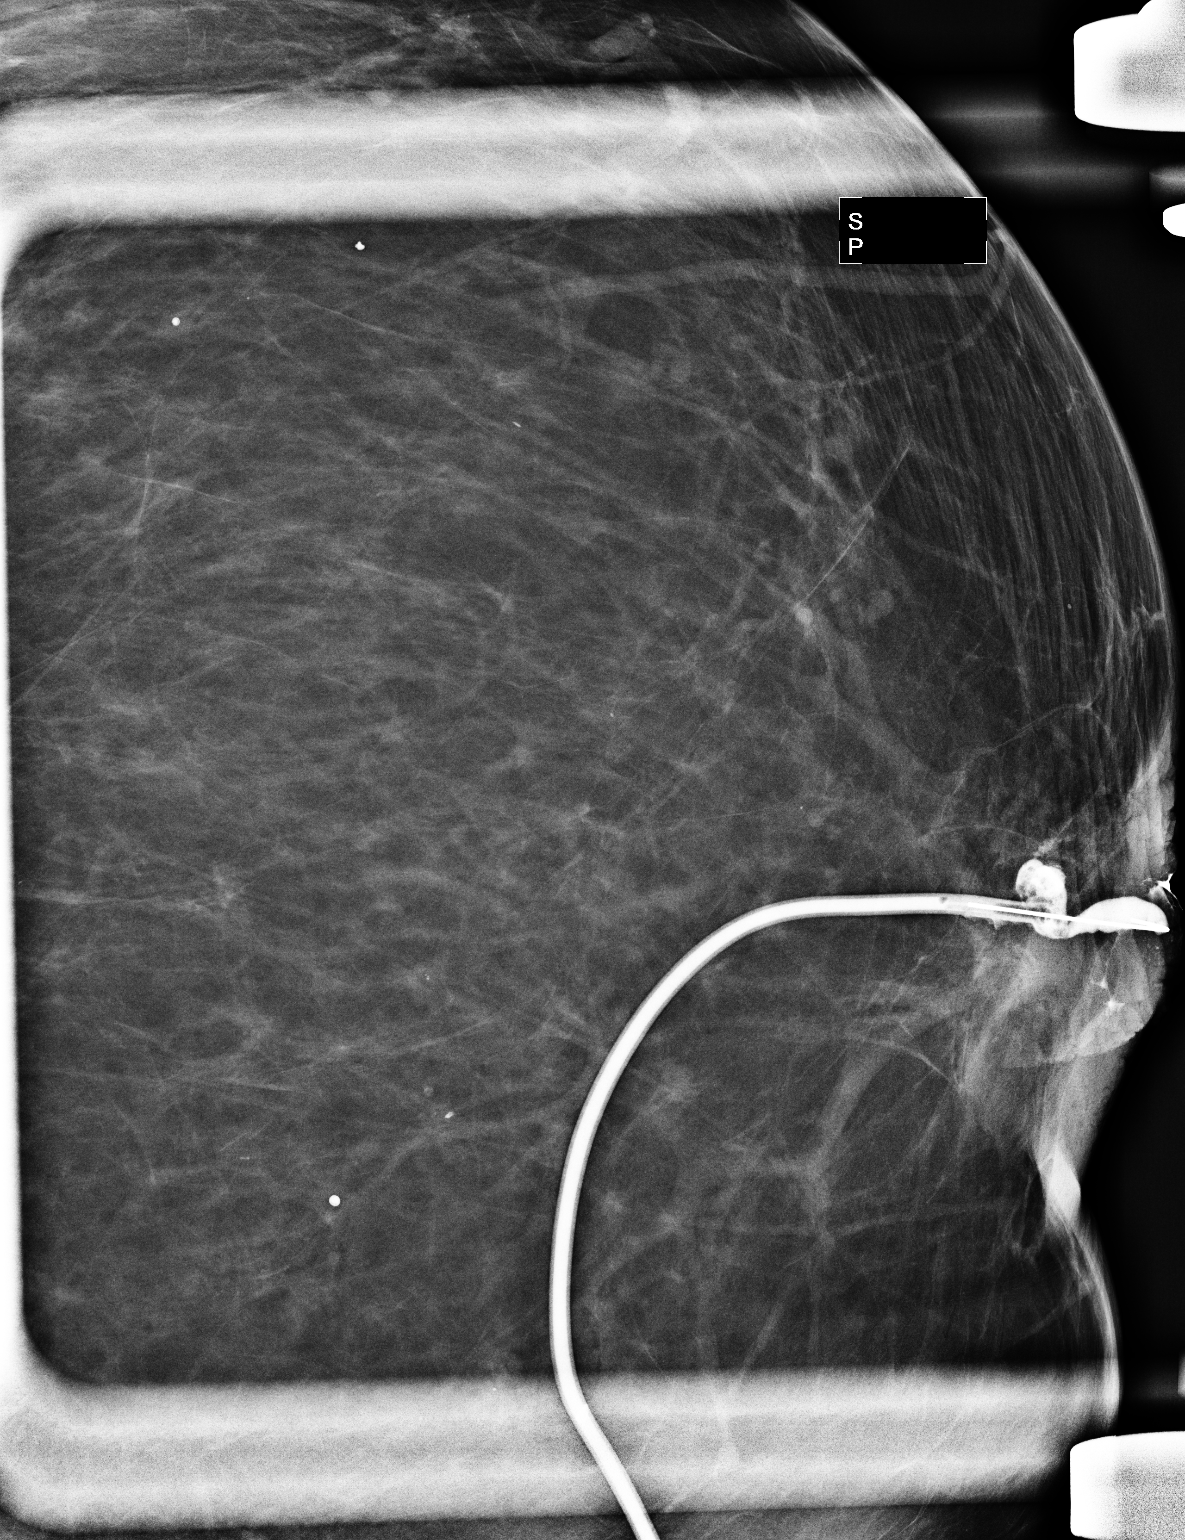

[L ML]
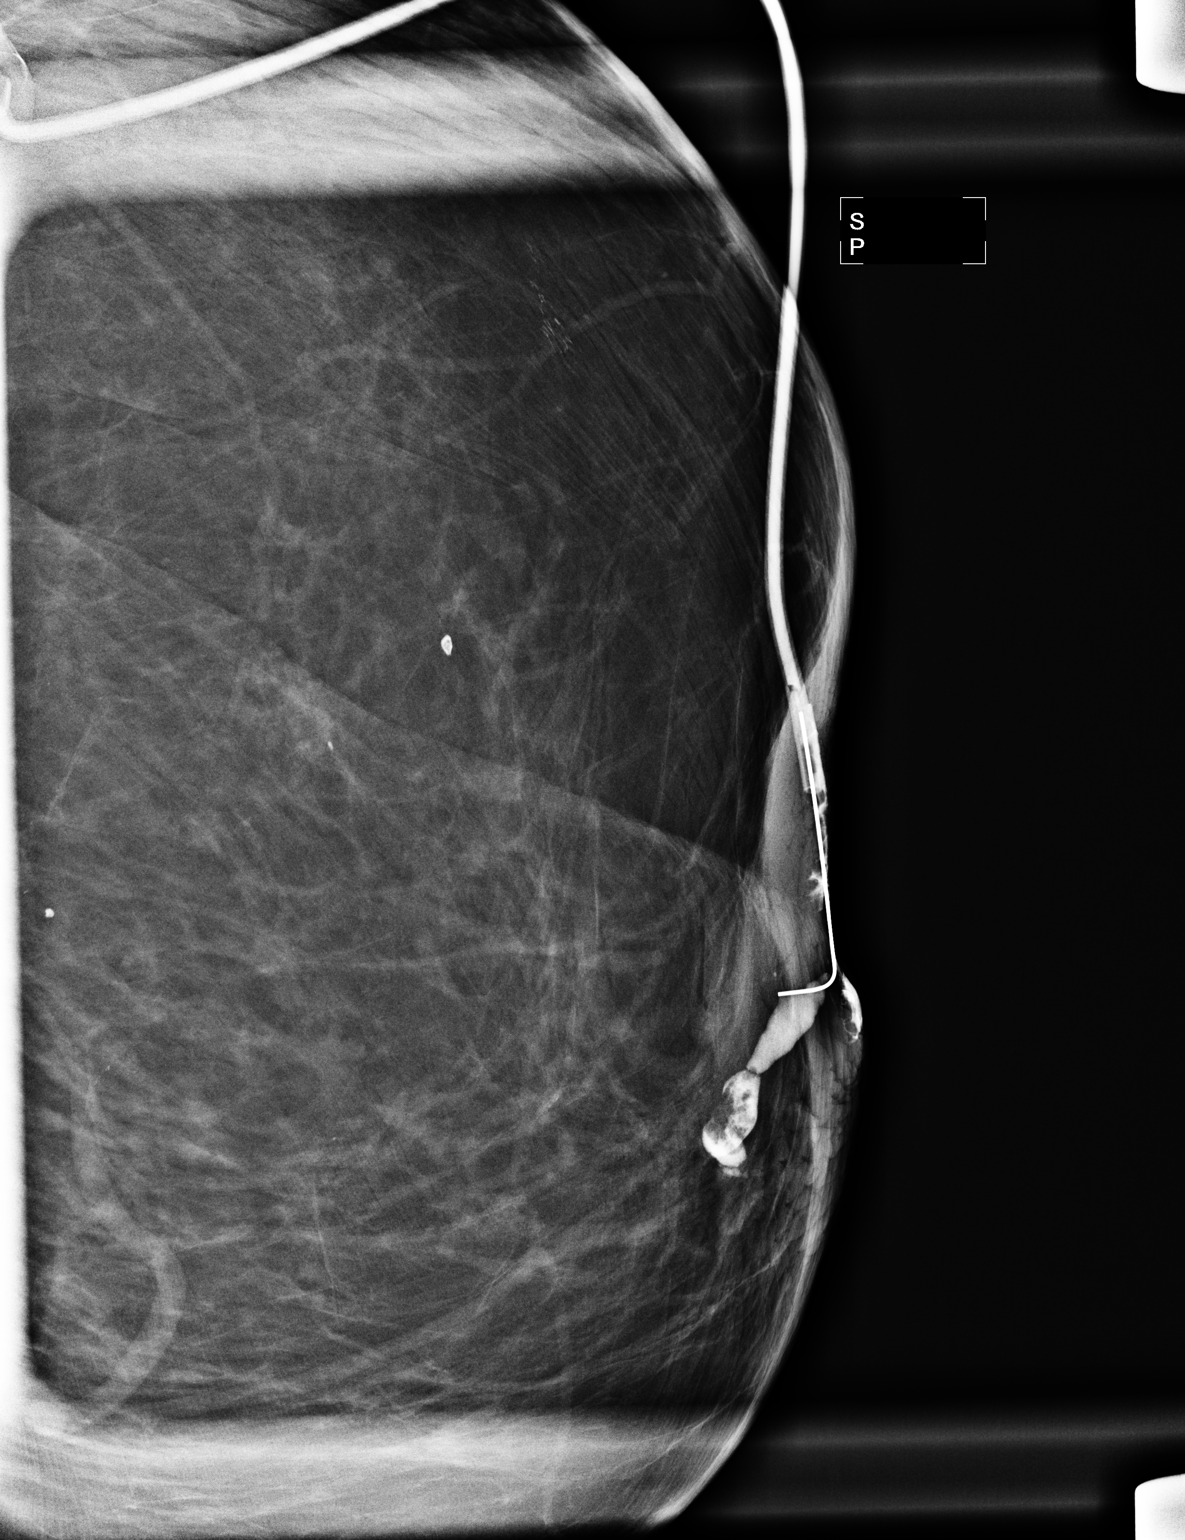

[L MLO]
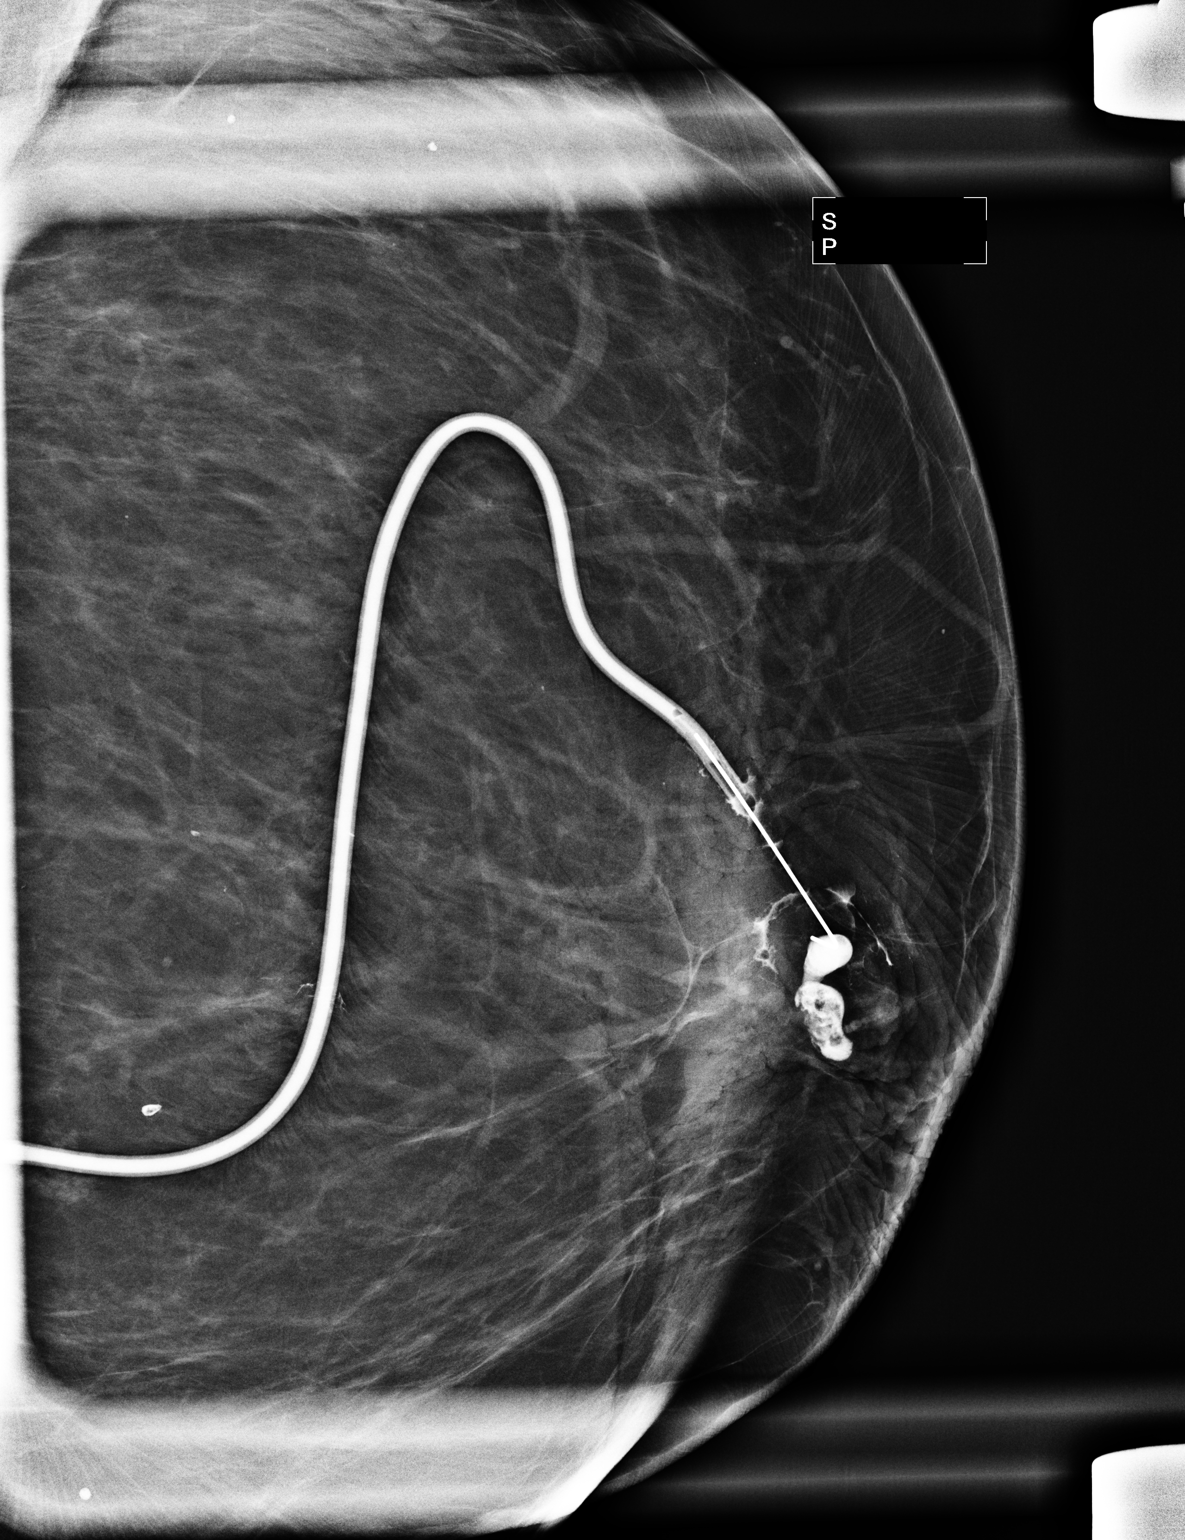

[3 of 3 positions shown; findings below may reference images not displayed]

FINDINGS: On physical exam, I palpate light pink clear discharge from the
central portion of the left nipple. With injection of contrast,
there is a 1 cm irregular filling defect within the discharging duct
in the 6 o'clock location, approximately 2 cm from the duct orifice.
The duct is also blind-ending be on the filling defect. Findings are
consistent with papilloma. Excision is recommended.
IMPRESSION: Findings are suspicious for intraductal papilloma. I discussed the
findings with the patient. The patient lives in [[place]
and is considering her options regarding Surgical consultation. She
will followup with Dr. NAZARETH JUMPER .

RECOMMENDATION:
Surgical consultation regarding left duct excision.

## 2017-01-27 DEATH — deceased
